# Patient Record
Sex: Female | Born: 1955 | Race: Black or African American | Hispanic: No | Marital: Married | Smoking: Never smoker
Health system: Southern US, Community
[De-identification: ages and names within clinical notes are randomized; demographics above are authoritative.]

## PROBLEM LIST (undated history)

## (undated) DIAGNOSIS — I10 Essential (primary) hypertension: Secondary | ICD-10-CM

## (undated) HISTORY — PX: TUBAL LIGATION: SHX77

---

## 2011-07-31 ENCOUNTER — Ambulatory Visit: Payer: Self-pay | Admitting: Family Medicine

## 2012-09-02 ENCOUNTER — Ambulatory Visit: Payer: Self-pay | Admitting: Nurse Practitioner

## 2014-04-27 ENCOUNTER — Ambulatory Visit: Payer: Self-pay | Admitting: Family Medicine

## 2015-02-21 DIAGNOSIS — M222X9 Patellofemoral disorders, unspecified knee: Secondary | ICD-10-CM | POA: Insufficient documentation

## 2015-02-21 DIAGNOSIS — M2240 Chondromalacia patellae, unspecified knee: Secondary | ICD-10-CM | POA: Insufficient documentation

## 2016-03-06 ENCOUNTER — Other Ambulatory Visit: Payer: Self-pay | Admitting: Nurse Practitioner

## 2016-03-06 DIAGNOSIS — Z1231 Encounter for screening mammogram for malignant neoplasm of breast: Secondary | ICD-10-CM

## 2016-03-07 ENCOUNTER — Encounter (HOSPITAL_COMMUNITY): Payer: Self-pay

## 2016-03-07 ENCOUNTER — Ambulatory Visit
Admission: RE | Admit: 2016-03-07 | Discharge: 2016-03-07 | Disposition: A | Discharge status: Home / Self Care | Payer: BLUE CROSS/BLUE SHIELD | Source: Ambulatory Visit | Attending: Nurse Practitioner | Admitting: Nurse Practitioner

## 2016-03-07 DIAGNOSIS — Z1231 Encounter for screening mammogram for malignant neoplasm of breast: Secondary | ICD-10-CM | POA: Insufficient documentation

## 2016-12-13 ENCOUNTER — Emergency Department
Admission: EM | Admit: 2016-12-13 | Discharge: 2016-12-13 | Disposition: A | Discharge status: Home / Self Care | Payer: BLUE CROSS/BLUE SHIELD | Attending: Emergency Medicine | Admitting: Emergency Medicine

## 2016-12-13 ENCOUNTER — Encounter: Payer: Self-pay | Admitting: Emergency Medicine

## 2016-12-13 ENCOUNTER — Emergency Department: Payer: BLUE CROSS/BLUE SHIELD

## 2016-12-13 DIAGNOSIS — M542 Cervicalgia: Secondary | ICD-10-CM | POA: Diagnosis present

## 2016-12-13 DIAGNOSIS — Y999 Unspecified external cause status: Secondary | ICD-10-CM | POA: Diagnosis not present

## 2016-12-13 DIAGNOSIS — Y929 Unspecified place or not applicable: Secondary | ICD-10-CM | POA: Insufficient documentation

## 2016-12-13 DIAGNOSIS — Y939 Activity, unspecified: Secondary | ICD-10-CM | POA: Insufficient documentation

## 2016-12-13 DIAGNOSIS — R51 Headache: Secondary | ICD-10-CM | POA: Insufficient documentation

## 2016-12-13 MED ORDER — CYCLOBENZAPRINE HCL 5 MG PO TABS: 5.0000 mg | ORAL_TABLET | Freq: Three times a day (TID) | ORAL | 0 refills | Status: AC | PRN

## 2016-12-13 MED ORDER — MELOXICAM 7.5 MG PO TABS: 7.5000 mg | ORAL_TABLET | Freq: Every day | ORAL | 1 refills | Status: AC

## 2016-12-13 MED ORDER — ORPHENADRINE CITRATE 30 MG/ML IJ SOLN
60.0000 mg | Freq: Two times a day (BID) | INTRAMUSCULAR | Status: DC
  Administered 2016-12-13: 60 mg via INTRAMUSCULAR
  Filled 2016-12-13: qty 2

## 2016-12-13 MED ORDER — KETOROLAC TROMETHAMINE 30 MG/ML IJ SOLN
30.0000 mg | Freq: Once | INTRAMUSCULAR | Status: AC
  Administered 2016-12-13: 30 mg via INTRAMUSCULAR
  Filled 2016-12-13: qty 1
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
## 2016-12-13 NOTE — ED Notes (Signed)
Patient transported to X-ray 

## 2016-12-18 ENCOUNTER — Ambulatory Visit
Admission: RE | Admit: 2016-12-18 | Discharge: 2016-12-18 | Disposition: A | Discharge status: Home / Self Care | Payer: BLUE CROSS/BLUE SHIELD | Source: Ambulatory Visit | Attending: Chiropractor | Admitting: Chiropractor

## 2016-12-18 ENCOUNTER — Other Ambulatory Visit: Payer: Self-pay | Admitting: Chiropractor

## 2016-12-18 DIAGNOSIS — M5136 Other intervertebral disc degeneration, lumbar region: Secondary | ICD-10-CM | POA: Insufficient documentation

## 2016-12-18 DIAGNOSIS — Z041 Encounter for examination and observation following transport accident: Secondary | ICD-10-CM | POA: Insufficient documentation

## 2016-12-18 DIAGNOSIS — M4316 Spondylolisthesis, lumbar region: Secondary | ICD-10-CM | POA: Insufficient documentation

## 2016-12-18 DIAGNOSIS — M47814 Spondylosis without myelopathy or radiculopathy, thoracic region: Secondary | ICD-10-CM | POA: Diagnosis not present

## 2017-05-28 ENCOUNTER — Other Ambulatory Visit: Payer: Self-pay | Admitting: Nurse Practitioner

## 2017-05-28 DIAGNOSIS — Z1239 Encounter for other screening for malignant neoplasm of breast: Secondary | ICD-10-CM

## 2018-09-11 ENCOUNTER — Other Ambulatory Visit: Payer: Self-pay | Admitting: Family Medicine

## 2018-09-11 DIAGNOSIS — Z1231 Encounter for screening mammogram for malignant neoplasm of breast: Secondary | ICD-10-CM

## 2018-09-21 DIAGNOSIS — M65311 Trigger thumb, right thumb: Secondary | ICD-10-CM | POA: Insufficient documentation

## 2018-09-28 ENCOUNTER — Other Ambulatory Visit: Payer: Self-pay

## 2018-09-28 DIAGNOSIS — Z1211 Encounter for screening for malignant neoplasm of colon: Secondary | ICD-10-CM

## 2018-09-28 NOTE — Progress Notes (Signed)
Gastroenterology Pre-Procedure Review  Request Date: 10/12/18 Requesting Physician: Dr. Marius Ditch  PATIENT REVIEW QUESTIONS: The patient responded to the following health history questions as indicated:    1. Are you having any GI issues? no 2. Do you have a personal history of Polyps? no 3. Do you have a family history of Colon Cancer or Polyps? no 4. Diabetes Mellitus? no 5. Joint replacements in the past 12 months?no 6. Major health problems in the past 3 months?no 7. Any artificial heart valves, MVP, or defibrillator?no    MEDICATIONS & ALLERGIES:    Patient reports the following regarding taking any anticoagulation/antiplatelet therapy:   Plavix, Coumadin, Eliquis, Xarelto, Lovenox, Pradaxa, Brilinta, or Effient? no Aspirin? no  Patient confirms/reports the following medications:  No current outpatient medications on file.   No current facility-administered medications for this visit.     Patient confirms/reports the following allergies:  No Known Allergies  No orders of the defined types were placed in this encounter.   AUTHORIZATION INFORMATION Primary Insurance: 1D#: Group #:  Secondary Insurance: 1D#: Group #:  SCHEDULE INFORMATION: Date: 10/12/18 Time: Location:[facility]

## 2018-09-30 ENCOUNTER — Telehealth: Payer: Self-pay | Admitting: Gastroenterology

## 2018-09-30 NOTE — Telephone Encounter (Signed)
Patient called & l/m on v/m to cx her colonoscopy on 10-12-18 . We are out of her insurance network.

## 2018-10-01 NOTE — Telephone Encounter (Signed)
Is it for [facility] or [facility] or both?  Thanks RV

## 2018-10-01 NOTE — Telephone Encounter (Signed)
Pt insurance is out of network, she is unable to have procedure done by [facility]

## 2018-10-01 NOTE — Telephone Encounter (Signed)
Trish in Endo has been asked to cancel patients colonoscopy scheduled for 10/12/18 with Dr. Marius Ditch due to insurance not in network.  Thanks [employer]

## 2018-10-01 NOTE — Telephone Encounter (Signed)
I can do in [place] if her insurance allows  RV

## 2018-10-12 ENCOUNTER — Ambulatory Visit: Admit: 2018-10-12 | Payer: BLUE CROSS/BLUE SHIELD | Admitting: Gastroenterology

## 2018-10-12 SURGERY — COLONOSCOPY WITH PROPOFOL
Anesthesia: General

## 2019-11-22 ENCOUNTER — Other Ambulatory Visit: Payer: Self-pay | Admitting: Family Medicine

## 2019-11-22 DIAGNOSIS — Z1231 Encounter for screening mammogram for malignant neoplasm of breast: Secondary | ICD-10-CM

## 2019-12-06 ENCOUNTER — Other Ambulatory Visit: Payer: Self-pay

## 2019-12-06 ENCOUNTER — Telehealth (INDEPENDENT_AMBULATORY_CARE_PROVIDER_SITE_OTHER): Payer: Self-pay | Admitting: Gastroenterology

## 2019-12-06 DIAGNOSIS — Z1211 Encounter for screening for malignant neoplasm of colon: Secondary | ICD-10-CM

## 2019-12-06 MED ORDER — NA SULFATE-K SULFATE-MG SULF 17.5-3.13-1.6 GM/177ML PO SOLN: 1.0000 | Freq: Once | ORAL | 0 refills | Status: AC

## 2019-12-06 NOTE — Progress Notes (Signed)
Gastroenterology Pre-Procedure Review  Request Date: Monday 01/10/20 Requesting Physician: Dr. Allegra Lai  PATIENT REVIEW QUESTIONS: The patient responded to the following health history questions as indicated:    1. Are you having any GI issues? no 2. Do you have a personal history of Polyps? no 3. Do you have a family history of Colon Cancer or Polyps? no 4. Diabetes Mellitus? no 5. Joint replacements in the past 12 months?no 6. Major health problems in the past 3 months?no 7. Any artificial heart valves, MVP, or defibrillator?no    MEDICATIONS & ALLERGIES:    Patient reports the following regarding taking any anticoagulation/antiplatelet therapy:   Plavix, Coumadin, Eliquis, Xarelto, Lovenox, Pradaxa, Brilinta, or Effient? no Aspirin? no  Patient confirms/reports the following medications:  Current Outpatient Medications  Medication Sig Dispense Refill  . clobetasol cream (TEMOVATE) 0.05 % SMARTSIG:Sparingly Topical Twice Daily    . clobetasol ointment (TEMOVATE) 0.05 % clobetasol 0.05 % topical ointment    . EPINEPHrine 0.3 mg/0.3 mL IJ SOAJ injection epinephrine 0.3 mg/0.3 mL injection, auto-injector    . meloxicam (MOBIC) 15 MG tablet meloxicam 15 mg tablet  TAKE 1 TABLET BY MOUTH ONCE DAILY WITH MEALS    . PARoxetine (PAXIL) 10 MG tablet paroxetine 10 mg tablet    . triamterene-hydrochlorothiazide (DYAZIDE) 37.5-25 MG capsule triamterene 37.5 mg-hydrochlorothiazide 25 mg capsule     No current facility-administered medications for this visit.    Patient confirms/reports the following allergies:  Allergies  Allergen Reactions  . No Known Allergies     No orders of the defined types were placed in this encounter.   AUTHORIZATION INFORMATION Primary Insurance: 1D#: Group #:  Secondary Insurance: 1D#: Group #:  SCHEDULE INFORMATION: Date: Monday 01/10/20 Time: Location:[facility]

## 2020-01-05 ENCOUNTER — Telehealth: Payer: Self-pay

## 2020-01-05 NOTE — Telephone Encounter (Signed)
Returned patients call. LVM for patient to call office back for prep instructions.

## 2020-01-06 ENCOUNTER — Telehealth: Payer: Self-pay

## 2020-01-06 ENCOUNTER — Other Ambulatory Visit
Admission: RE | Admit: 2020-01-06 | Discharge: 2020-01-06 | Disposition: A | Discharge status: Home / Self Care | Payer: 59 | Source: Ambulatory Visit | Attending: Gastroenterology | Admitting: Gastroenterology

## 2020-01-06 ENCOUNTER — Other Ambulatory Visit: Payer: Self-pay

## 2020-01-06 DIAGNOSIS — Z20822 Contact with and (suspected) exposure to covid-19: Secondary | ICD-10-CM | POA: Insufficient documentation

## 2020-01-06 DIAGNOSIS — Z01812 Encounter for preprocedural laboratory examination: Secondary | ICD-10-CM | POA: Diagnosis present

## 2020-01-06 LAB — SARS CORONAVIRUS 2 (TAT 6-24 HRS): SARS Coronavirus 2: NEGATIVE

## 2020-01-06 NOTE — Telephone Encounter (Signed)
Returned patients call. Informed patient she will call the endo unit tomorrow to get her time for procedure. Pt verbalized understanding.

## 2020-01-07 ENCOUNTER — Ambulatory Visit
Admission: RE | Admit: 2020-01-07 | Discharge: 2020-01-07 | Disposition: A | Discharge status: Home / Self Care | Payer: 59 | Source: Ambulatory Visit | Attending: Family Medicine | Admitting: Family Medicine

## 2020-01-07 DIAGNOSIS — Z1231 Encounter for screening mammogram for malignant neoplasm of breast: Secondary | ICD-10-CM

## 2020-01-10 ENCOUNTER — Other Ambulatory Visit: Payer: Self-pay

## 2020-01-10 ENCOUNTER — Ambulatory Visit: Payer: 59 | Admitting: Anesthesiology

## 2020-01-10 ENCOUNTER — Encounter
Admission: RE | Disposition: A | Discharge status: Home / Self Care | Payer: Self-pay | Source: Home / Self Care | Attending: Gastroenterology

## 2020-01-10 ENCOUNTER — Ambulatory Visit
Admission: RE | Admit: 2020-01-10 | Discharge: 2020-01-10 | Disposition: A | Discharge status: Home / Self Care | Payer: 59 | Attending: Gastroenterology | Admitting: Gastroenterology

## 2020-01-10 ENCOUNTER — Encounter: Payer: Self-pay | Admitting: Gastroenterology

## 2020-01-10 DIAGNOSIS — Z791 Long term (current) use of non-steroidal anti-inflammatories (NSAID): Secondary | ICD-10-CM | POA: Diagnosis not present

## 2020-01-10 DIAGNOSIS — Z79899 Other long term (current) drug therapy: Secondary | ICD-10-CM | POA: Diagnosis not present

## 2020-01-10 DIAGNOSIS — Z1211 Encounter for screening for malignant neoplasm of colon: Secondary | ICD-10-CM

## 2020-01-10 DIAGNOSIS — K635 Polyp of colon: Secondary | ICD-10-CM | POA: Insufficient documentation

## 2020-01-10 DIAGNOSIS — I10 Essential (primary) hypertension: Secondary | ICD-10-CM | POA: Diagnosis not present

## 2020-01-10 HISTORY — DX: Essential (primary) hypertension: I10

## 2020-01-10 HISTORY — PX: COLONOSCOPY WITH PROPOFOL: SHX5780

## 2020-01-10 SURGERY — COLONOSCOPY WITH PROPOFOL
Anesthesia: General

## 2020-01-10 MED ORDER — SODIUM CHLORIDE 0.9 % IV SOLN
INTRAVENOUS | Status: DC
  Administered 2020-01-10: 1000 mL via INTRAVENOUS

## 2020-01-10 MED ORDER — PROPOFOL 500 MG/50ML IV EMUL
INTRAVENOUS | Status: DC | PRN
  Administered 2020-01-10: 160 ug/kg/min via INTRAVENOUS

## 2020-01-10 MED ORDER — LIDOCAINE 2% (20 MG/ML) 5 ML SYRINGE
INTRAMUSCULAR | Status: DC | PRN
  Administered 2020-01-10: 50 mg via INTRAVENOUS

## 2020-01-10 MED ORDER — PROPOFOL 500 MG/50ML IV EMUL
INTRAVENOUS | Status: AC
  Filled 2020-01-10: qty 50

## 2020-01-10 MED ORDER — LIDOCAINE HCL (PF) 2 % IJ SOLN
INTRAMUSCULAR | Status: AC
  Filled 2020-01-10: qty 5

## 2020-01-10 MED ORDER — PROPOFOL 10 MG/ML IV BOLUS
INTRAVENOUS | Status: DC | PRN
  Administered 2020-01-10: 90 mg via INTRAVENOUS

## 2020-01-10 NOTE — H&P (Signed)
Arlyss Repress, MD [address]  [address]  [place], [place] [address]  Main: [phone]  Fax: [phone] Pager: [phone]  Primary Care Physician:  [facility] Primary Gastroenterologist:  Dr. Arlyss Repress  Pre-Procedure History & Physical: HPI:  Tanya Pacheco is a 64 y.o. female is here for an colonoscopy.   Past Medical History:  Diagnosis Date  . Hypertension     Past Surgical History:  Procedure Laterality Date  . TUBAL LIGATION      Prior to Admission medications   Medication Sig Start Date End Date Taking? Authorizing Provider  clobetasol cream (TEMOVATE) 0.05 % SMARTSIG:Sparingly Topical Twice Daily 11/18/19   [provider]  clobetasol ointment (TEMOVATE) 0.05 % clobetasol 0.05 % topical ointment    [provider]  EPINEPHrine 0.3 mg/0.3 mL IJ SOAJ injection epinephrine 0.3 mg/0.3 mL injection, auto-injector    [provider]  meloxicam (MOBIC) 15 MG tablet meloxicam 15 mg tablet  TAKE 1 TABLET BY MOUTH ONCE DAILY WITH MEALS    [provider]  PARoxetine (PAXIL) 10 MG tablet paroxetine 10 mg tablet    [provider]  triamterene-hydrochlorothiazide (DYAZIDE) 37.5-25 MG capsule triamterene 37.5 mg-hydrochlorothiazide 25 mg capsule    [provider]    Allergies as of 12/06/2019 - Review Complete 12/06/2019  Allergen Reaction Noted  . No known allergies  12/06/2019    Family History  Problem Relation Age of Onset  . Breast cancer Neg Hx     Social History   Socioeconomic History  . Marital status: Married    Spouse name: Not on file  . Number of children: Not on file  . Years of education: Not on file  . Highest education level: Not on file  Occupational History  . Not on file  Tobacco Use  . Smoking status: Never Smoker  . Smokeless tobacco: Never Used  Vaping Use  . Vaping Use: Never used  Substance and Sexual Activity  . Alcohol use: No  . Drug  use: No  . Sexual activity: Not on file  Other Topics Concern  . Not on file  Social History Narrative  . Not on file   Social Determinants of Health   Financial Resource Strain:   . Difficulty of Paying Living Expenses: Not on file  Food Insecurity:   . Worried About Programme researcher, broadcasting/film/video in the Last Year: Not on file  . Ran Out of Food in the Last Year: Not on file  Transportation Needs:   . Lack of Transportation (Medical): Not on file  . Lack of Transportation (Non-Medical): Not on file  Physical Activity:   . Days of Exercise per Week: Not on file  . Minutes of Exercise per Session: Not on file  Stress:   . Feeling of Stress : Not on file  Social Connections:   . Frequency of Communication with Friends and Family: Not on file  . Frequency of Social Gatherings with Friends and Family: Not on file  . Attends Religious Services: Not on file  . Active Member of Clubs or Organizations: Not on file  . Attends Banker Meetings: Not on file  . Marital Status: Not on file  Intimate Partner Violence:   . Fear of Current or Ex-Partner: Not on file  . Emotionally Abused: Not on file  . Physically Abused: Not on file  . Sexually Abused: Not on file    Review of Systems: See HPI, otherwise negative ROS  Physical Exam: BP (!) 152/84   Pulse 70   Temp (!) 97 F (36.1 C)   Resp 18   Ht 5\' 6"  (1.676 m)   Wt 92.3 kg   SpO2 100%   BMI 32.83 kg/m  General:   Alert,  pleasant and cooperative in NAD Head:  Normocephalic and atraumatic. Neck:  Supple; no masses or thyromegaly. Lungs:  Clear throughout to auscultation.    Heart:  Regular rate and rhythm. Abdomen:  Soft, nontender and nondistended. Normal bowel sounds, without guarding, and without rebound.   Neurologic:  Alert and  oriented x4;  grossly normal neurologically.  Impression/Plan: Tanya Pacheco is here for an colonoscopy to be performed for colon cancer screening  Risks, benefits, limitations, and  alternatives regarding  colonoscopy have been reviewed with the patient.  Questions have been answered.  All parties agreeable.   Alphia Kava, MD  01/10/2020, 10:14 AM

## 2020-01-10 NOTE — Transfer of Care (Signed)
Immediate Anesthesia Transfer of Care Note  Patient: Tanya Pacheco  Procedure(s) Performed: COLONOSCOPY WITH PROPOFOL (N/A )  Patient Location: Endoscopy Unit  Anesthesia Type:General  Level of Consciousness: sedated  Airway & Oxygen Therapy: Patient connected to nasal cannula oxygen  Post-op Assessment: Post -op Vital signs reviewed and stable  Post vital signs: stable  Last Vitals:  Vitals Value Taken Time  BP 110/73 01/10/20 1051  Temp    Pulse 60 01/10/20 1052  Resp 1 01/10/20 1052  SpO2 100 % 01/10/20 1052  Vitals shown include unvalidated device data.  Last Pain:  Vitals:   01/10/20 1051  PainSc: Asleep         Complications: No complications documented.

## 2020-01-10 NOTE — Op Note (Signed)
[facility] Gastroenterology Patient Name: Kharizma Lesnick Procedure Date: 01/10/2020 10:25 AM MRN: [id number] Account #: [id number] Date of Birth: [birth year] Admit Type: Outpatient Age: 64 Room: [facility] ENDO ROOM 2 Gender: Female Note Status: Finalized Procedure:             Colonoscopy Indications:           Screening for colorectal malignant neoplasm Providers:             Toney Reil MD, MD Medicines:             General Anesthesia Complications:         No immediate complications. Estimated blood loss: None. Procedure:             Pre-Anesthesia Assessment:                        - Prior to the procedure, a History and Physical was                         performed, and patient medications and allergies were                         reviewed. The patient is competent. The risks and                         benefits of the procedure and the sedation options and                         risks were discussed with the patient. All questions                         were answered and informed consent was obtained.                         Patient identification and proposed procedure were                         verified by the physician, the nurse, the                         anesthesiologist, the anesthetist and the technician                         in the pre-procedure area in the procedure room in the                         endoscopy suite. Mental Status Examination: alert and                         oriented. Airway Examination: normal oropharyngeal                         airway and neck mobility. Respiratory Examination:                         clear to auscultation. CV Examination: normal.                         Prophylactic Antibiotics: The patient does not require  prophylactic antibiotics. Prior Anticoagulants: The                         patient has taken no previous anticoagulant or                         antiplatelet agents.  ASA Grade Assessment: II - A                         patient with mild systemic disease. After reviewing                         the risks and benefits, the patient was deemed in                         satisfactory condition to undergo the procedure. The                         anesthesia plan was to use general anesthesia.                         Immediately prior to administration of medications,                         the patient was re-assessed for adequacy to receive                         sedatives. The heart rate, respiratory rate, oxygen                         saturations, blood pressure, adequacy of pulmonary                         ventilation, and response to care were monitored                         throughout the procedure. The physical status of the                         patient was re-assessed after the procedure.                        After obtaining informed consent, the colonoscope was                         passed under direct vision. Throughout the procedure,                         the patient's blood pressure, pulse, and oxygen                         saturations were monitored continuously. The                         Colonoscope was introduced through the anus and                         advanced to the the cecum, identified by appendiceal  orifice and ileocecal valve. The colonoscopy was                         performed without difficulty. The patient tolerated                         the procedure well. The quality of the bowel                         preparation was evaluated using the BBPS [facility] Bowel                         Preparation Scale) with scores of: Right Colon = 3,                         Transverse Colon = 3 and Left Colon = 3 (entire mucosa                         seen well with no residual staining, small fragments                         of stool or opaque liquid). The total BBPS score                          equals 9. Findings:      The perianal and digital rectal examinations were normal. Pertinent       negatives include normal sphincter tone and no palpable rectal lesions.      A 5 mm polyp was found in the transverse colon. The polyp was sessile.       The polyp was removed with a cold snare. Resection and retrieval were       complete.      The retroflexed view of the distal rectum and anal verge was normal and       showed no anal or rectal abnormalities. Impression:            - One 5 mm polyp in the transverse colon, removed with                         a cold snare. Resected and retrieved.                        - The distal rectum and anal verge are normal on                         retroflexion view. Recommendation:        - Discharge patient to home (with escort).                        - Resume previous diet today.                        - Continue present medications.                        - Await pathology results.                        - Repeat  colonoscopy in 7 years for surveillance. Procedure Code(s):     --- Professional ---                        [id number], Colonoscopy, flexible; with removal of                         tumor(s), polyp(s), or other lesion(s) by snare                         technique Diagnosis Code(s):     --- Professional ---                        Z12.11, Encounter for screening for malignant neoplasm                         of colon                        K63.5, Polyp of colon CPT copyright 2019 American Medical Association. All rights reserved. The codes documented in this report are preliminary and upon coder review may  be revised to meet current compliance requirements. Dr. Libby Mawonini Tekeisha Hakim Toney Reilohini Reddy Sahirah Rudell MD, MD 01/10/2020 10:47:19 AM This report has been signed electronically. Number of Addenda: 0 Note Initiated On: 01/10/2020 10:25 AM Scope Withdrawal Time: 0 hours 10 minutes 47 seconds  Total Procedure Duration: 0 hours 13 minutes 53 seconds   Estimated Blood Loss:  Estimated blood loss: none.      [facility]

## 2020-01-10 NOTE — Anesthesia Preprocedure Evaluation (Signed)
Anesthesia Evaluation  Patient identified by MRN, date of birth, ID band Patient awake    Reviewed: Allergy & Precautions, H&P , NPO status , Patient's Chart, lab work & pertinent test results, reviewed documented beta blocker date and time   Airway Mallampati: II   Neck ROM: full    Dental  (+) Poor Dentition   Pulmonary neg pulmonary ROS,    Pulmonary exam normal        Cardiovascular Exercise Tolerance: Good hypertension, negative cardio ROS Normal cardiovascular exam Rhythm:regular Rate:Normal     Neuro/Psych negative neurological ROS  negative psych ROS   GI/Hepatic negative GI ROS, Neg liver ROS,   Endo/Other  negative endocrine ROS  Renal/GU negative Renal ROS  negative genitourinary   Musculoskeletal   Abdominal   Peds  Hematology negative hematology ROS (+)   Anesthesia Other Findings Past Medical History: No date: Hypertension Past Surgical History: No date: TUBAL LIGATION BMI    Body Mass Index: 32.83 kg/m     Reproductive/Obstetrics negative OB ROS                             Anesthesia Physical Anesthesia Plan  ASA: II  Anesthesia Plan: General   Post-op Pain Management:    Induction:   PONV Risk Score and Plan:   Airway Management Planned:   Additional Equipment:   Intra-op Plan:   Post-operative Plan:   Informed Consent: I have reviewed the patients History and Physical, chart, labs and discussed the procedure including the risks, benefits and alternatives for the proposed anesthesia with the patient or authorized representative who has indicated his/her understanding and acceptance.     Dental Advisory Given  Plan Discussed with: CRNA  Anesthesia Plan Comments:         Anesthesia Quick Evaluation

## 2020-01-11 ENCOUNTER — Encounter: Payer: Self-pay | Admitting: Gastroenterology

## 2020-01-11 NOTE — Anesthesia Postprocedure Evaluation (Signed)
Anesthesia Post Note  Patient: Tanya Pacheco  Procedure(s) Performed: COLONOSCOPY WITH PROPOFOL (N/A )  Patient location during evaluation: PACU Anesthesia Type: General Level of consciousness: awake and alert Pain management: pain level controlled Vital Signs Assessment: post-procedure vital signs reviewed and stable Respiratory status: spontaneous breathing, nonlabored ventilation, respiratory function stable and patient connected to nasal cannula oxygen Cardiovascular status: blood pressure returned to baseline and stable Postop Assessment: no apparent nausea or vomiting Anesthetic complications: no   No complications documented.   Last Vitals:  Vitals:   01/10/20 1101 01/10/20 1111  BP: 119/70 128/87  Pulse: 65 64  Resp:  16  Temp:    SpO2:      Last Pain:  Vitals:   01/11/20 0733  PainSc: 0-No pain                 Yevette Edwards

## 2020-01-12 ENCOUNTER — Encounter: Payer: Self-pay | Admitting: Gastroenterology

## 2020-01-12 LAB — SURGICAL PATHOLOGY

## 2020-12-05 ENCOUNTER — Other Ambulatory Visit: Payer: Self-pay | Admitting: Family Medicine

## 2020-12-05 DIAGNOSIS — Z1231 Encounter for screening mammogram for malignant neoplasm of breast: Secondary | ICD-10-CM

## 2020-12-18 DIAGNOSIS — Z23 Encounter for immunization: Secondary | ICD-10-CM | POA: Diagnosis not present

## 2021-01-08 ENCOUNTER — Ambulatory Visit
Admission: RE | Admit: 2021-01-08 | Discharge: 2021-01-08 | Disposition: A | Discharge status: Home / Self Care | Payer: BLUE CROSS/BLUE SHIELD | Source: Ambulatory Visit | Attending: Family Medicine | Admitting: Family Medicine

## 2021-01-08 ENCOUNTER — Other Ambulatory Visit: Payer: Self-pay

## 2021-01-08 DIAGNOSIS — Z1231 Encounter for screening mammogram for malignant neoplasm of breast: Secondary | ICD-10-CM | POA: Insufficient documentation

## 2021-11-06 ENCOUNTER — Other Ambulatory Visit: Payer: Self-pay

## 2021-11-06 ENCOUNTER — Telehealth: Payer: Self-pay

## 2021-11-06 DIAGNOSIS — Z1211 Encounter for screening for malignant neoplasm of colon: Secondary | ICD-10-CM

## 2021-11-06 MED ORDER — NA SULFATE-K SULFATE-MG SULF 17.5-3.13-1.6 GM/177ML PO SOLN: 354.0000 mL | Freq: Once | ORAL | 0 refills | Status: AC

## 2021-11-06 NOTE — Telephone Encounter (Signed)
Gastroenterology Pre-Procedure Review  Request Date: 11/27/2021 Requesting Physician: Dr. Tobi Bastos  PATIENT REVIEW QUESTIONS: The patient responded to the following health history questions as indicated:    1. Are you having any GI issues? no 2. Do you have a personal history of Polyps? no 3. Do you have a family history of Colon Cancer or Polyps? no 4. Diabetes Mellitus? no 5. Joint replacements in the past 12 months?no 6. Major health problems in the past 3 months?no 7. Any artificial heart valves, MVP, or defibrillator?no    MEDICATIONS & ALLERGIES:    Patient reports the following regarding taking any anticoagulation/antiplatelet therapy:   Plavix, Coumadin, Eliquis, Xarelto, Lovenox, Pradaxa, Brilinta, or Effient? no Aspirin? no  Patient confirms/reports the following medications:  Current Outpatient Medications  Medication Sig Dispense Refill   clobetasol cream (TEMOVATE) 0.05 % SMARTSIG:Sparingly Topical Twice Daily     clobetasol ointment (TEMOVATE) 0.05 % clobetasol 0.05 % topical ointment     EPINEPHrine 0.3 mg/0.3 mL IJ SOAJ injection epinephrine 0.3 mg/0.3 mL injection, auto-injector     meloxicam (MOBIC) 15 MG tablet meloxicam 15 mg tablet  TAKE 1 TABLET BY MOUTH ONCE DAILY WITH MEALS     PARoxetine (PAXIL) 10 MG tablet paroxetine 10 mg tablet     triamterene-hydrochlorothiazide (DYAZIDE) 37.5-25 MG capsule triamterene 37.5 mg-hydrochlorothiazide 25 mg capsule     No current facility-administered medications for this visit.    Patient confirms/reports the following allergies:  Allergies  Allergen Reactions   No Known Allergies     No orders of the defined types were placed in this encounter.   AUTHORIZATION INFORMATION Primary Insurance: 1D#: Group #:  Secondary Insurance: 1D#: Group #:  SCHEDULE INFORMATION: Date:  Time: Location:

## 2021-11-14 ENCOUNTER — Ambulatory Visit: Payer: Medicare Other | Admitting: Podiatry

## 2021-11-22 ENCOUNTER — Other Ambulatory Visit: Payer: Self-pay

## 2021-11-22 MED ORDER — PEG 3350-KCL-NA BICARB-NACL 420 G PO SOLR: 4000.0000 mL | Freq: Once | ORAL | 0 refills | Status: AC

## 2021-11-27 ENCOUNTER — Encounter
Admission: RE | Disposition: A | Discharge status: Home / Self Care | Payer: Self-pay | Source: Ambulatory Visit | Attending: Gastroenterology

## 2021-11-27 ENCOUNTER — Ambulatory Visit
Admission: RE | Admit: 2021-11-27 | Discharge: 2021-11-27 | Disposition: A | Discharge status: Home / Self Care | Payer: Medicare Other | Source: Ambulatory Visit | Attending: Gastroenterology | Admitting: Gastroenterology

## 2021-11-27 ENCOUNTER — Encounter: Payer: Self-pay | Admitting: Anesthesiology

## 2021-11-27 DIAGNOSIS — Z1211 Encounter for screening for malignant neoplasm of colon: Secondary | ICD-10-CM

## 2021-11-27 IMAGING — MG MM DIGITAL SCREENING BILAT W/ TOMO AND CAD
8 series · 8 of 24 positions shown · non-contrast
Comparison: Previous exam(s).

CLINICAL DATA: Screening.

EXAM:
DIGITAL SCREENING BILATERAL MAMMOGRAM WITH TOMOSYNTHESIS AND CAD
TECHNIQUE: Bilateral screening digital craniocaudal and mediolateral oblique
mammograms were obtained. Bilateral screening digital breast
tomosynthesis was performed. The images were evaluated with
computer-aided detection.

[R MLO synth-2D]
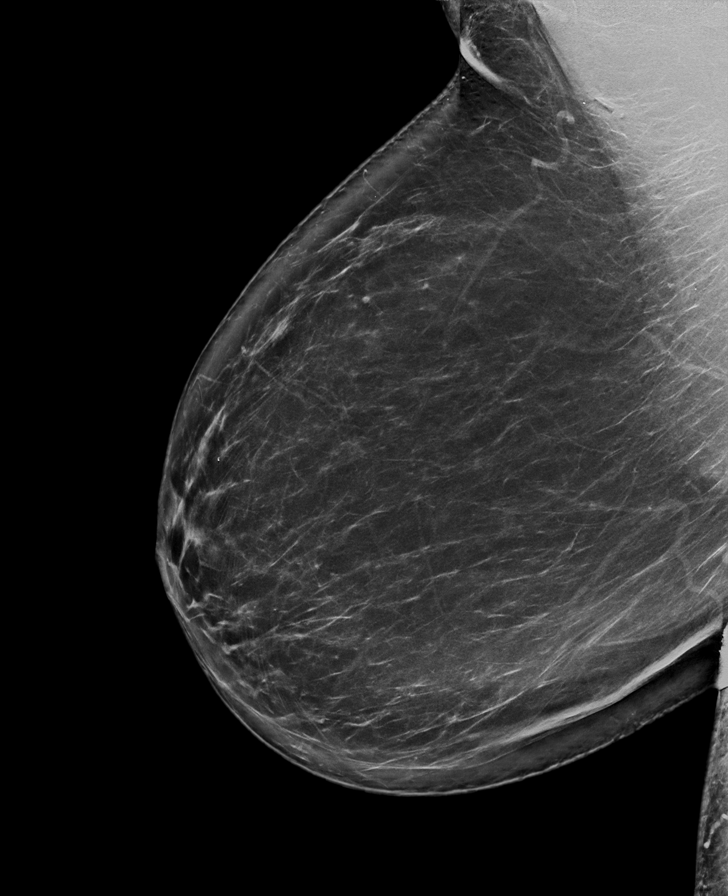

[L MLO synth-2D]
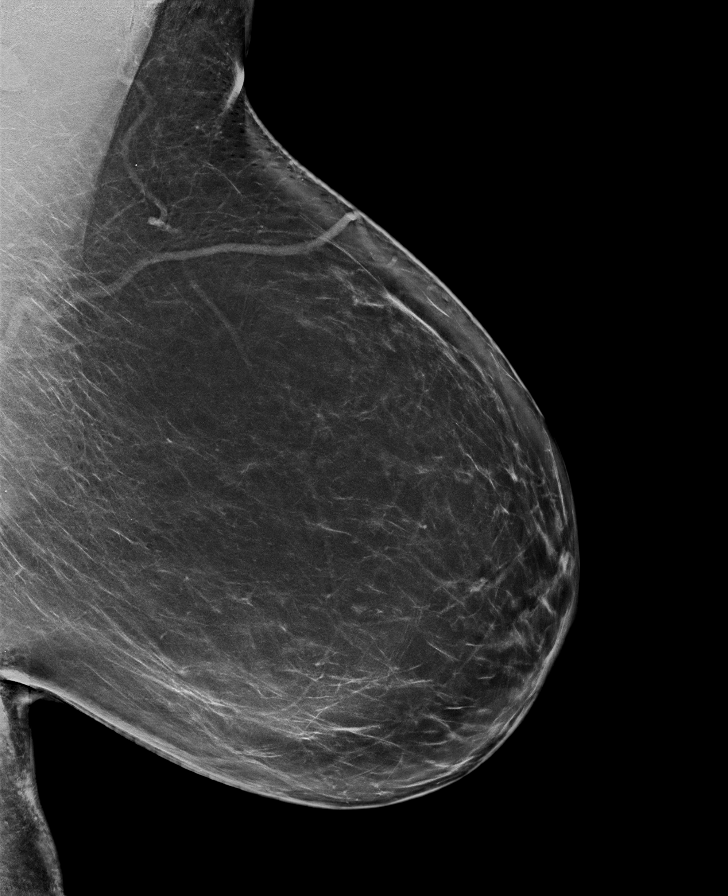

[R CC synth-2D]
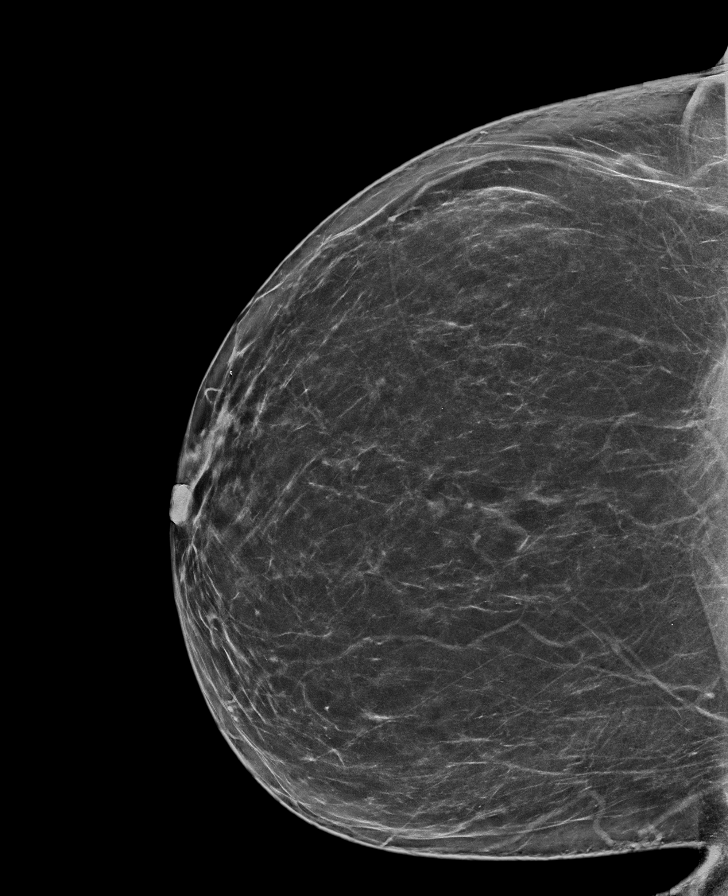

[L CC synth-2D]
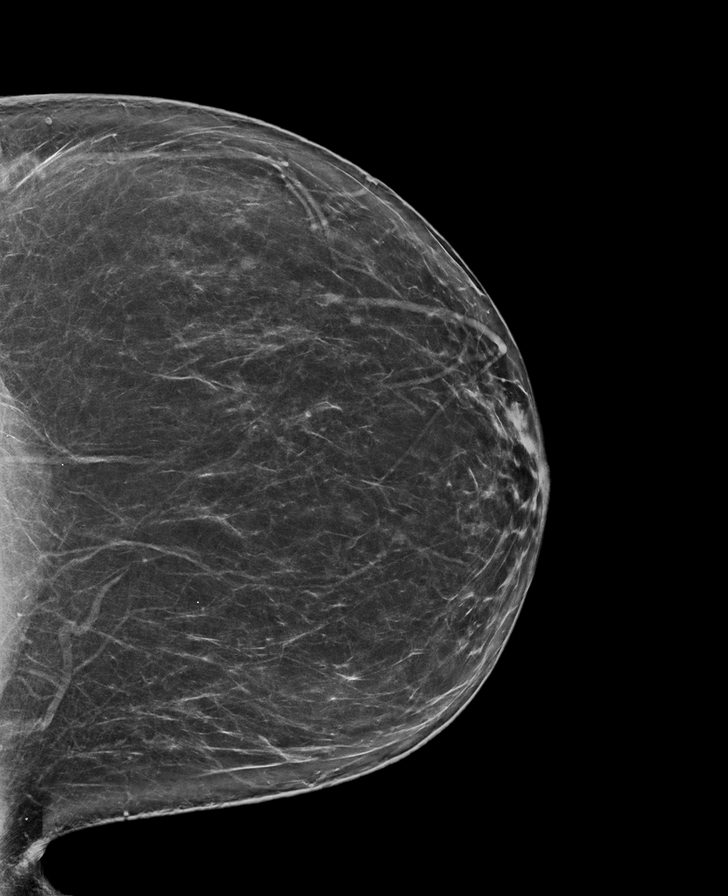

[L MLO tomo · tomo slice 51/101.0]
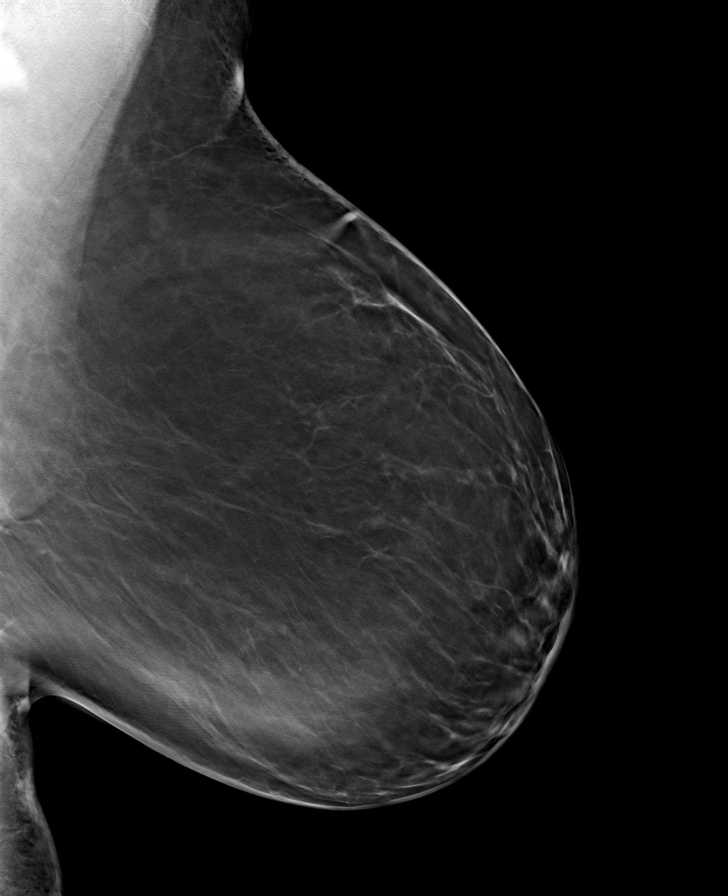

[R MLO tomo · tomo slice 49/97.0]
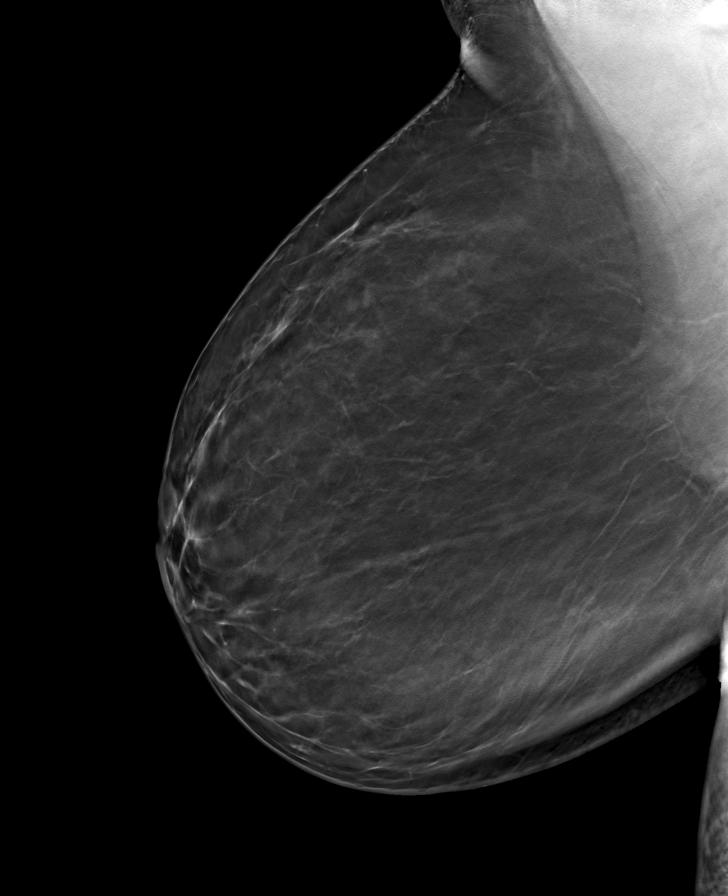

[L CC tomo · tomo slice 43/85.0]
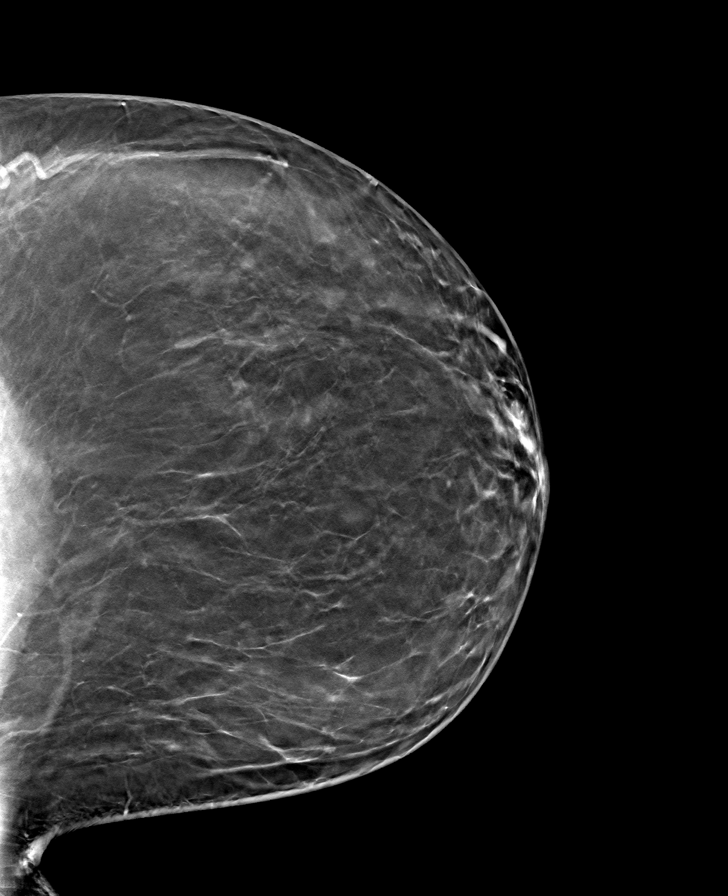

[R CC tomo · tomo slice 43/85.0]
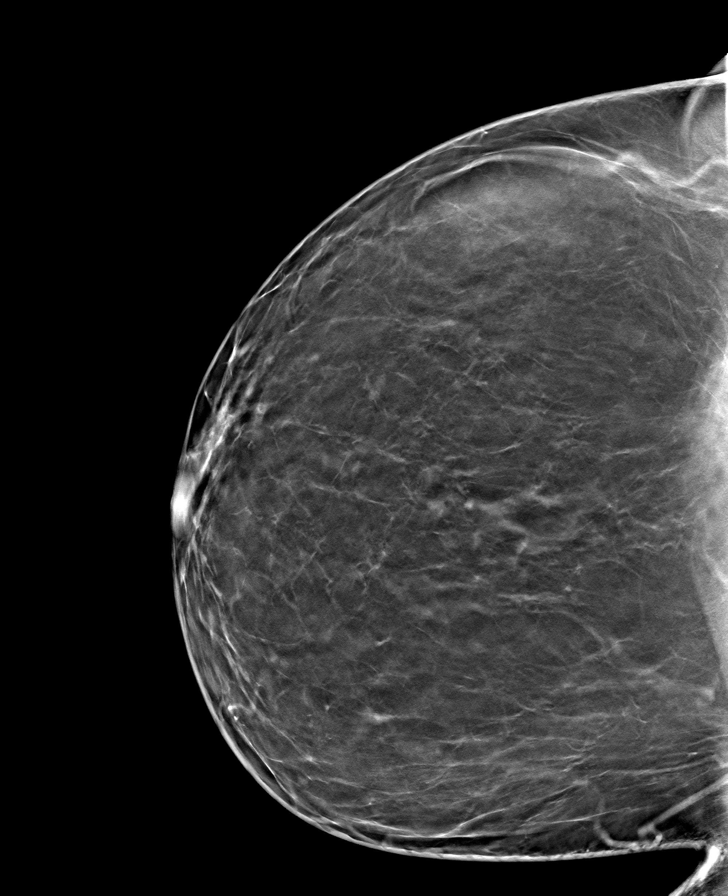

[8 of 24 positions shown; findings below may reference images not displayed]

ACR Breast Density Category b: There are scattered areas of
fibroglandular density.
FINDINGS: There are no findings suspicious for malignancy.
IMPRESSION: No mammographic evidence of malignancy. A result letter of this
screening mammogram will be mailed directly to the patient.

RECOMMENDATION:
Screening mammogram in one year. (Code:51-O-LD2)

BI-RADS CATEGORY  1: Negative.

## 2021-11-27 SURGERY — COLONOSCOPY WITH PROPOFOL
Anesthesia: General

## 2021-11-27 MED ORDER — SODIUM CHLORIDE 0.9 % IV SOLN: INTRAVENOUS | Status: DC

## 2021-11-27 NOTE — Brief Op Note (Signed)
Patient arrives for repeat colonoscopy.After Dr.Kobe Jansma in to talk with patient that she did not need repeat colonoscopy.She can repeat in 6 years.Patient is ok with this and leaves with no problems.

## 2021-11-27 NOTE — Anesthesia Preprocedure Evaluation (Signed)
Anesthesia Evaluation  Patient identified by MRN, date of birth, ID band Patient awake    Reviewed: Allergy & Precautions, H&P , NPO status , Patient's Chart, lab work & pertinent test results, reviewed documented beta blocker date and time   Airway Mallampati: II   Neck ROM: full    Dental  (+) Poor Dentition   Pulmonary neg pulmonary ROS,    Pulmonary exam normal        Cardiovascular Exercise Tolerance: Good hypertension, On Medications negative cardio ROS Normal cardiovascular exam Rhythm:regular Rate:Normal     Neuro/Psych negative neurological ROS  negative psych ROS   GI/Hepatic negative GI ROS, Neg liver ROS,   Endo/Other  negative endocrine ROS  Renal/GU negative Renal ROS  negative genitourinary   Musculoskeletal   Abdominal   Peds  Hematology negative hematology ROS (+)   Anesthesia Other Findings Past Medical History: No date: Hypertension Past Surgical History: 01/10/2020: COLONOSCOPY WITH PROPOFOL; N/A     Comment:  Procedure: COLONOSCOPY WITH PROPOFOL;  Surgeon: Toney Reil, MD;  Location: [facility] ENDOSCOPY;  Service:               Gastroenterology;  Laterality: N/A; No date: TUBAL LIGATION   Reproductive/Obstetrics negative OB ROS                             Anesthesia Physical Anesthesia Plan  ASA: 2  Anesthesia Plan: General   Post-op Pain Management:    Induction:   PONV Risk Score and Plan:   Airway Management Planned:   Additional Equipment:   Intra-op Plan:   Post-operative Plan:   Informed Consent: I have reviewed the patients History and Physical, chart, labs and discussed the procedure including the risks, benefits and alternatives for the proposed anesthesia with the patient or authorized representative who has indicated his/her understanding and acceptance.     Dental Advisory Given  Plan Discussed with:  CRNA  Anesthesia Plan Comments:         Anesthesia Quick Evaluation

## 2021-12-21 ENCOUNTER — Other Ambulatory Visit: Payer: Self-pay | Admitting: Family Medicine

## 2021-12-21 DIAGNOSIS — Z1231 Encounter for screening mammogram for malignant neoplasm of breast: Secondary | ICD-10-CM

## 2021-12-21 DIAGNOSIS — Z780 Asymptomatic menopausal state: Secondary | ICD-10-CM

## 2022-05-15 ENCOUNTER — Ambulatory Visit
Admission: RE | Admit: 2022-05-15 | Discharge: 2022-05-15 | Disposition: A | Discharge status: Home / Self Care | Payer: Medicare Other | Source: Ambulatory Visit | Attending: Family Medicine | Admitting: Family Medicine

## 2022-05-15 DIAGNOSIS — Z780 Asymptomatic menopausal state: Secondary | ICD-10-CM | POA: Diagnosis present

## 2022-05-15 DIAGNOSIS — Z1231 Encounter for screening mammogram for malignant neoplasm of breast: Secondary | ICD-10-CM | POA: Insufficient documentation

## 2022-09-15 ENCOUNTER — Emergency Department: Payer: Medicare Other

## 2022-09-15 ENCOUNTER — Emergency Department
Admission: EM | Admit: 2022-09-15 | Discharge: 2022-09-15 | Disposition: A | Discharge status: Home / Self Care | Payer: Medicare Other | Attending: Emergency Medicine | Admitting: Emergency Medicine

## 2022-09-15 ENCOUNTER — Other Ambulatory Visit: Payer: Self-pay

## 2022-09-15 DIAGNOSIS — Z23 Encounter for immunization: Secondary | ICD-10-CM | POA: Insufficient documentation

## 2022-09-15 DIAGNOSIS — S0181XA Laceration without foreign body of other part of head, initial encounter: Secondary | ICD-10-CM | POA: Diagnosis present

## 2022-09-15 DIAGNOSIS — W228XXA Striking against or struck by other objects, initial encounter: Secondary | ICD-10-CM | POA: Insufficient documentation

## 2022-09-15 DIAGNOSIS — I10 Essential (primary) hypertension: Secondary | ICD-10-CM | POA: Insufficient documentation

## 2022-09-15 DIAGNOSIS — W19XXXA Unspecified fall, initial encounter: Secondary | ICD-10-CM

## 2022-09-15 MED ORDER — BACITRACIN ZINC 500 UNIT/GM EX OINT
TOPICAL_OINTMENT | Freq: Once | CUTANEOUS | Status: AC
  Administered 2022-09-15: 1 via TOPICAL
  Filled 2022-09-15: qty 0.9

## 2022-09-15 MED ORDER — LIDOCAINE HCL (PF) 1 % IJ SOLN: 5.0000 mL | Freq: Once | INTRAMUSCULAR | Status: DC

## 2022-09-15 MED ORDER — TETANUS-DIPHTH-ACELL PERTUSSIS 5-2.5-18.5 LF-MCG/0.5 IM SUSY
0.5000 mL | PREFILLED_SYRINGE | Freq: Once | INTRAMUSCULAR | Status: AC
  Administered 2022-09-15: 0.5 mL via INTRAMUSCULAR
  Filled 2022-09-15: qty 0.5

## 2022-09-15 MED ORDER — ACETAMINOPHEN 325 MG PO TABS
650.0000 mg | ORAL_TABLET | Freq: Once | ORAL | Status: AC
  Administered 2022-09-15: 650 mg via ORAL
  Filled 2022-09-15: qty 2

## 2022-09-15 NOTE — ED Notes (Signed)
Patient transported to CT 

## 2022-09-15 NOTE — ED Provider Notes (Signed)
[facility] Provider Note    Event Date/Time   First MD Initiated Contact with Patient 09/15/22 [phone]     (approximate)   History   Laceration (Left eye brow)   HPI  Tanya Pacheco is a 67 y.o. female   Past medical history of hypertension presents emergency department with a laceration above the left eyebrow after she rolled out of bed and hit her head on the dresser.  No other injuries noted.  No blood thinners.  Otherwise in her regular state of health no other acute medical complaints.  Independent Historian contributed to assessment above: Her sister and niece are at bedside corroborates information past medical history as above       Physical Exam   Triage Vital Signs: ED Triage Vitals [09/15/22 0226]  Enc Vitals Group     BP (!) 186/109     Pulse Rate 85     Resp 16     Temp 98.1 F (36.7 C)     Temp Source Oral     SpO2 98 %     Weight 194 lb 0.1 oz (88 kg)     Height 5\' 6"  (1.676 m)     Head Circumference      Peak Flow      Pain Score 8     Pain Loc      Pain Edu?      Excl. in GC?     Most recent vital signs: Vitals:   09/15/22 0226  BP: (!) 186/109  Pulse: 85  Resp: 16  Temp: 98.1 F (36.7 C)  SpO2: 98%    General: Awake, no distress.  CV:  Good peripheral perfusion.  Resp:  Normal effort.  Abd:  No distention.  Other:  Awake alert comfortable appearing joking with this provider, hypertensive but otherwise hemodynamics appropriate reassuring.  She has a approximately 1 and half centimeter gaping laceration above the left eyebrow.  No other facial trauma; neck supple with full range of motion ranging all extremities full active range of motion.   ED Results / Procedures / Treatments   Labs (all labs ordered are listed, but only abnormal results are displayed) Labs Reviewed - No data to display RADIOLOGY I independently reviewed and interpreted CT of the head I see no obvious bleeding or midline  shift   PROCEDURES:  Critical Care performed: No  ..Laceration Repair  Date/Time: 09/15/2022 4:21 AM  Performed by: Pilar Jarvis, MD Authorized by: Pilar Jarvis, MD   Consent:    Consent obtained:  Verbal   Consent given by:  Patient   Risks, benefits, and alternatives were discussed: yes     Risks discussed:  Infection, pain, poor cosmetic result, need for additional repair and nerve damage   Alternatives discussed:  No treatment, delayed treatment and observation Universal protocol:    Procedure explained and questions answered to patient or proxy's satisfaction: yes     Patient identity confirmed:  Verbally with patient Anesthesia:    Anesthesia method:  Local infiltration   Local anesthetic:  Lidocaine 1% w/o epi Laceration details:    Location:  Face   Face location:  L eyebrow   Length (cm):  1.5   Depth (mm):  3 Exploration:    Limited defect created (wound extended): no     Hemostasis achieved with:  Direct pressure   Wound exploration: wound explored through full range of motion     Wound extent: no foreign body and no signs  of injury     Contaminated: no   Treatment:    Area cleansed with:  Saline   Amount of cleaning:  Standard   Irrigation solution:  Tap water   Irrigation method:  Syringe   Visualized foreign bodies/material removed: no     Debridement:  None   Undermining:  None   Scar revision: no   Skin repair:    Repair method:  Sutures   Suture size:  5-0   Suture material:  Fast-absorbing gut   Suture technique:  Simple interrupted   Number of sutures:  5 Approximation:    Approximation:  Close Repair type:    Repair type:  Simple Post-procedure details:    Dressing:  Antibiotic ointment and adhesive bandage   Procedure completion:  Tolerated    MEDICATIONS ORDERED IN ED: Medications  lidocaine (PF) (XYLOCAINE) 1 % injection 5 mL (has no administration in time range)  bacitracin ointment (has no administration in time range)  Tdap  (BOOSTRIX) injection 0.5 mL (has no administration in time range)  acetaminophen (TYLENOL) tablet 650 mg (650 mg Oral Given 09/15/22 0245)    IMPRESSION / MDM / ASSESSMENT AND PLAN / ED COURSE  I reviewed the triage vital signs and the nursing notes.                                Patient's presentation is most consistent with acute presentation with potential threat to life or bodily function.  Differential diagnosis includes, but is not limited to, blunt traumatic injury including intracranial bleeding, facial fracture, laceration   The patient is on the cardiac monitor to evaluate for evidence of arrhythmia and/or significant heart rate changes.  MDM: Mechanical injury slip and rolled out of bed with facial injury laceration repaired as above.  CT imaging to rule out intracranial bleeding or facial fracture negative.  Patient appears well jovial, anticipatory guidance given, unknown last tetanus so given a Tdap.  Discharged with PMD follow-up for wound check.        FINAL CLINICAL IMPRESSION(S) / ED DIAGNOSES   Final diagnoses:  Facial laceration, initial encounter  Fall, initial encounter     Rx / DC Orders   ED Discharge Orders     None        Note:  This document was prepared using Dragon voice recognition software and may include unintentional dictation errors.    Pilar Jarvis, MD 09/15/22 [phone]

## 2022-09-15 NOTE — ED Triage Notes (Signed)
Pt to ed from home via POV for fall out of her bed. She rolled out of bed and hit her face on the corner of the bedside table. Pt has moderate swelling to left eye and laceration with bleeding controlled. Pt is CAOX4, in no acute distress and ambulatory in triage.

## 2022-09-15 NOTE — Discharge Instructions (Addendum)
You have absorbable stitches which will fall out on their own in about 5 days.  If you notice that the sutures are still in place after about 7 days see your doctor to get them removed.  I want you to see your doctor within the next week for a wound check.  Please keep your wound clean by washing at least daily with soap and water.  Apply antibiotic ointment and a bandage. If you see any signs of infection like spreading redness, pus coming from the wound, extreme pain, fevers, chills or any other worsening doctor right away or come back to the emergency department   Take Tylenol 650 mg every 6 hours as needed for pain.

## 2022-09-15 NOTE — ED Notes (Addendum)
Pt asking for pain meds. Pt offered tylenol. Pt asked "Do I get the whole bottle since I am paying for it".

## 2023-04-02 ENCOUNTER — Other Ambulatory Visit: Payer: Self-pay | Admitting: Family Medicine

## 2023-04-02 DIAGNOSIS — R42 Dizziness and giddiness: Secondary | ICD-10-CM

## 2023-04-02 DIAGNOSIS — R269 Unspecified abnormalities of gait and mobility: Secondary | ICD-10-CM

## 2023-04-11 ENCOUNTER — Ambulatory Visit: Payer: Medicare Other

## 2023-04-13 ENCOUNTER — Ambulatory Visit
Admission: RE | Admit: 2023-04-13 | Discharge: 2023-04-13 | Disposition: A | Discharge status: Home / Self Care | Payer: Medicare Other | Source: Ambulatory Visit | Attending: Family Medicine | Admitting: Family Medicine

## 2023-04-13 DIAGNOSIS — R269 Unspecified abnormalities of gait and mobility: Secondary | ICD-10-CM | POA: Insufficient documentation

## 2023-04-13 DIAGNOSIS — R42 Dizziness and giddiness: Secondary | ICD-10-CM | POA: Diagnosis present

## 2023-11-26 NOTE — Progress Notes (Signed)
 Ref Provider: Buren Rock Browning, * PCP: Rollene Therisa Norris, NP Assessment and Plan:   In most patients we give written parts of assessment and plan to patient under Patient Instructions/After Visit Summary. So some parts are directed to patient.  Dear Ms. Elveria Finder, It was our pleasure to participate in your care in person. We have typed up brief summary of what we discussed.  Assessment & Plan Dizziness, likely benign paroxysmal positional vertigo Imbalance and dizziness exacerbated by head movements, likely benign paroxysmal positional vertigo due to positional changes. Medication side effects from Jardiance considered  - Order vestibular rehabilitation for dizziness and imbalance.  Imbalance in patient with suspected neuropathy  History of Type 2 diabetes. Decreased sensation to vibration in bilateral feet. - Order CBC, CMP, TSH (stop taking biotin supplements one day prior), Vit B12, Vit B1, Vit B6, Vit D, ESR, SIEP, folate, HgA1c.  - NCS/EMG of bilateral lower extremities    There are a few interventions the patient should consider to prevent falls: Be careful, Avoid high heel footwear, Use appropriate assistive device, Avoid hazards in your path,  (toys, pets, wires, etc.), Have good light when you walk, Use a nonskid shower mat/shower chair, Use handrails and grab bars. Check out www.stopfalls.org for more information.  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 Differential includes diabetic neuropathy. - Order nerve conduction study to assess nerve function in legs and feet. - Order blood work to evaluate for contributing factors to imbalance.  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 The reduction in blood, oxygen, and nutrients to nerve fibers damages the brain, resulting in white matter lesions. These lesions appear as bright spots on an MRI of the brain. Damage to important pathways in your brain can lead to problems with memory, balance, or gait. White matter microvascular ischemic and metabolic changes may also be referred to as white matter changes, white matter disease, or hardening of small blood vessels.  While some lesions can be a part of normal aging, factors such as smoking, poorly controlled diabetes, hypertension, uncontrolled obstructive sleep apnea, high cholesterol, alcohol use, and sedentary lifestyle can increase the risk of developing white matter microvascular ischemic and metabolic changes. Managing cardiovascular risks factors, as specified below, can slow the progression of white matter disease and help prevent life-threatening cardiovascular conditions such as stroke.  Patient should read about Life's Simple Seven by American Heart Association. 1) Quit/cut back on smoking & avoid excessive alcohol consumption (only applicable in patients who use nicotine and excessive alcohol) 2) Body mass index less than 25 kg/m2 3) Physical activity at least 150 minutes (Moderate intensity or 75 minutes (vigorous) each week 4) Eat healthy diet 5) Total cholesterol < 200 mg/dL, LDL < 70 mg/dL 6) Blood pressure < 879/19 mm Hg 7) Fasting blood glucose < 100 mg/dL 8) Have aggressive oral care / maintain good teeth cleaning and good gum health.   Follow up in 3 months with Allyson Stallion, FNP-BC  Return in about 3 months (around 02/26/2024) for Allyson Broody NP. This note has been created using automated tools and reviewed for accuracy by ELIAS GREGORY RODRIGUEZ.  History and Present Illness:   Ms. 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  She has been experiencing imbalance and a sensation of potentially falling over for several months. The dizziness occurs while moving and is exacerbated by putting her head back. No associated headaches, except when she does not eat, which she attributes to hunger.  She describes incidents where she tripped over objects and was unable to recover her balance, leading to falls. Her balance is off, but she does not experience spinning sensations. She feels like she is falling back when standing with her eyes closed.  An MRI of the brain conducted in January 2025 showed chronic microvascular ischemic disease but was otherwise unremarkable.  She has a history of diabetes, diagnosed in 2022, and her last A1c was over 8. She occasionally experiences tingling in her fingers and diminished sensitivity in her feet, which she attributes to jamming her toe.  She drinks alcohol occasionally, about once a month, and does not smoke. She works as a Engineer, structural twice a week. No weakness of limbs or slurred speech. Sleep is not an issue for her.  Head/neck/face CT 09/15/2022 - 1. No acute intracranial abnormality. 2. No acute fracture or static subluxation of the cervical spine. 3. No facial fracture.   MRI Brain 04/13/2023 - 1.  No evidence of an acute intracranial abnormality. 2. Mild multifocal T2 FLAIR hyperintense signal abnormality within the cerebral white matter, nonspecific but most often secondary to chronic small vessel ischemia.   General Exam:   Vitals:   11/27/23 0811  BP: 112/63  Pulse: 72  Weight: 83 kg (183 lb)  Height: 167.6 cm (5' 6)    Body mass index is 29.54 kg/m.  Physical Exam CHEST: Lungs clear to auscultation. CARDIOVASCULAR: Heart sounds S1 S2 normal. NEUROLOGICAL: Pupils equally unreactive to light and accommodation. Extraocular movements intact, no nystagmus. 5/5 strength in  bilateral upper and lower extremities. 2+ knee jerk, brachioradialis, and bicep reflexes bilaterally. Positive Romberg test.  Neurological exam appropriate for the patient's condition was performed.  Below information was reviewed by me.  Social Drivers of Health   Tobacco Use: Low Risk  (12/01/2023)   Patient History   . Smoking Tobacco Use: Never   . Smokeless Tobacco Use: Never   . Passive Exposure: Not on file  Alcohol Use: Unknown (11/27/2023)   AUDIT-C   . Frequency of Alcohol Consumption: Monthly or less   . Average Number of Drinks: Not on file   . Frequency of Binge Drinking: Not on file  Financial Resource Strain: Low Risk  (11/27/2023)   Overall Financial Resource Strain (CARDIA)   . Difficulty of Paying Living Expenses: Not very hard  Food Insecurity: Food Insecurity Present (11/27/2023)   Hunger Vital Sign   . Worried About Programme researcher, broadcasting/film/video in the Last Year: Sometimes true   . Ran Out of Food in the Last Year: Never true  Transportation Needs: No Transportation Needs (11/27/2023)   PRAPARE - Transportation   . Lack of Transportation (Medical): No   . Lack of Transportation (Non-Medical): No  Physical Activity: Not on file  Stress: Not on file  Social Connections: Not on file  Depression: Not at risk (11/27/2023)   PHQ-2   . PHQ-2 Score: 1  Housing Stability: Low Risk  (11/27/2023)   Housing Stability Vital Sign   . Unable to Pay for Housing in the Last Year: No   .  Number of Times Moved in the Last Year: 0   . Homeless in the Last Year: No  Utilities: Not At Risk (11/27/2023)   [facility] Utilities   . Threatened with loss of utilities: No  Health Literacy: Not on file    Medications: Current Outpatient Medications on File Prior to Visit  Medication Sig Dispense Refill  . empagliflozin (JARDIANCE) 25 mg tablet Take by mouth once daily    . losartan-hydroCHLOROthiazide (HYZAAR) 100-25 mg tablet Take 1 tablet by mouth once daily    . PARoxetine (PAXIL) 10 MG tablet TAKE 1  TABLET BY MOUTH AT NIGHT FOR HOT FLASHES    . rosuvastatin (CRESTOR) 40 MG tablet     . triamterene-hydroCHLOROthiazide (DYAZIDE) 37.5-25 mg capsule      No current facility-administered medications on file prior to visit.   Past Medical History:  Past Medical History:  Diagnosis Date  . Diabetes mellitus without complication (CMS/HHS-HCC)   . Hyperlipidemia   . Hypertension     Past Surgical History: History reviewed. No pertinent surgical history. Family History:  Family History  Problem Relation Name Age of Onset  . No Known Problems Mother    . No Known Problems Father     Social History:  Social History   Socioeconomic History  . Marital status: Widowed  Tobacco Use  . Smoking status: Never  . Smokeless tobacco: Never  Substance and Sexual Activity  . Drug use: Not Currently  . Sexual activity: Not Currently   Social Drivers of Health   Financial Resource Strain: Low Risk  (11/27/2023)   Overall Financial Resource Strain (CARDIA)   . Difficulty of Paying Living Expenses: Not very hard  Food Insecurity: Food Insecurity Present (11/27/2023)   Hunger Vital Sign   . Worried About Programme researcher, broadcasting/film/video in the Last Year: Sometimes true   . Ran Out of Food in the Last Year: Never true  Transportation Needs: No Transportation Needs (11/27/2023)   PRAPARE - Transportation   . Lack of Transportation (Medical): No   . Lack of Transportation (Non-Medical): No  Housing Stability: Low Risk  (11/27/2023)   Housing Stability Vital Sign   . Unable to Pay for Housing in the Last Year: No   . Number of Times Moved in the Last Year: 0   . Homeless in the Last Year: No   Allergies:  Allergies  Allergen Reactions  . Penicillin Unknown   This note has been created using automated tools and reviewed for accuracy by ELIAS GREGORY RODRIGUEZ.  Attestation Statement:   I personally performed the service, non-incident to. (WP)   ELIAS CORDELLA STALLION, NP

## 2023-12-02 NOTE — Telephone Encounter (Signed)
 Patient informed Referral to endo sent

## 2023-12-17 ENCOUNTER — Ambulatory Visit: Attending: Neurology

## 2023-12-17 DIAGNOSIS — R2689 Other abnormalities of gait and mobility: Secondary | ICD-10-CM | POA: Diagnosis present

## 2023-12-17 DIAGNOSIS — R296 Repeated falls: Secondary | ICD-10-CM | POA: Diagnosis present
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
## 2023-12-17 NOTE — Result Encounter Note (Signed)
 Reviewed result with patient/family at office visit.

## 2023-12-24 ENCOUNTER — Ambulatory Visit: Attending: Neurology

## 2023-12-24 DIAGNOSIS — R296 Repeated falls: Secondary | ICD-10-CM | POA: Diagnosis present

## 2023-12-24 DIAGNOSIS — R2689 Other abnormalities of gait and mobility: Secondary | ICD-10-CM | POA: Diagnosis present
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
## 2023-12-29 ENCOUNTER — Ambulatory Visit

## 2024-01-05 ENCOUNTER — Ambulatory Visit

## 2024-01-05 DIAGNOSIS — R2689 Other abnormalities of gait and mobility: Secondary | ICD-10-CM | POA: Diagnosis not present

## 2024-01-05 DIAGNOSIS — R296 Repeated falls: Secondary | ICD-10-CM
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
## 2024-01-12 ENCOUNTER — Ambulatory Visit

## 2024-01-15 ENCOUNTER — Other Ambulatory Visit: Payer: Self-pay

## 2024-01-15 DIAGNOSIS — E1165 Type 2 diabetes mellitus with hyperglycemia: Secondary | ICD-10-CM

## 2024-01-21 ENCOUNTER — Ambulatory Visit: Admitting: Physical Therapy

## 2024-01-26 ENCOUNTER — Ambulatory Visit: Attending: Neurology

## 2024-01-26 DIAGNOSIS — R2689 Other abnormalities of gait and mobility: Secondary | ICD-10-CM | POA: Insufficient documentation

## 2024-01-26 DIAGNOSIS — R296 Repeated falls: Secondary | ICD-10-CM | POA: Diagnosis present
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
## 2024-01-30 ENCOUNTER — Ambulatory Visit
Admission: RE | Admit: 2024-01-30 | Discharge: 2024-01-30 | Disposition: A | Discharge status: Home / Self Care | Source: Ambulatory Visit

## 2024-01-30 DIAGNOSIS — E1165 Type 2 diabetes mellitus with hyperglycemia: Secondary | ICD-10-CM | POA: Diagnosis present

## 2024-02-02 ENCOUNTER — Ambulatory Visit

## 2024-02-09 ENCOUNTER — Ambulatory Visit

## 2024-02-16 ENCOUNTER — Ambulatory Visit

## 2024-02-23 ENCOUNTER — Ambulatory Visit

## 2024-03-01 ENCOUNTER — Ambulatory Visit
